# Patient Record
Sex: Male | Born: 1970 | Race: White | Hispanic: No | Marital: Single
Health system: Southern US, Community
[De-identification: ages and names within clinical notes are randomized; demographics above are authoritative.]

---

## 2004-12-26 ENCOUNTER — Encounter: Admission: RE | Admit: 2004-12-26 | Discharge: 2004-12-26 | Payer: Self-pay | Admitting: Otolaryngology

## 2006-08-13 IMAGING — CT CT NECK W/ CM
1 of 2 series · 10 of 14 positions shown, 13 images · IV contrast (75ML OMNI 300)
Comparison: none

CLINICAL DATA: Occasional discomfort.  Generally painless right neck lump.  Question GERD.  No known trauma. 
CT NECK WITH CONTRAST:
TECHNIQUE: Multidetector CT imaging of the neck was performed following the standard protocol during administration of intravenous contrast.
Contrast:  75 cc Omnipaque 300
No comparison.

[Series 2: neck w/ · axial · 0.39mm/px · z∈[-274,-32]mm · 10 of 118 slices shown, 13 images]
[im 11/118  soft-tissue]
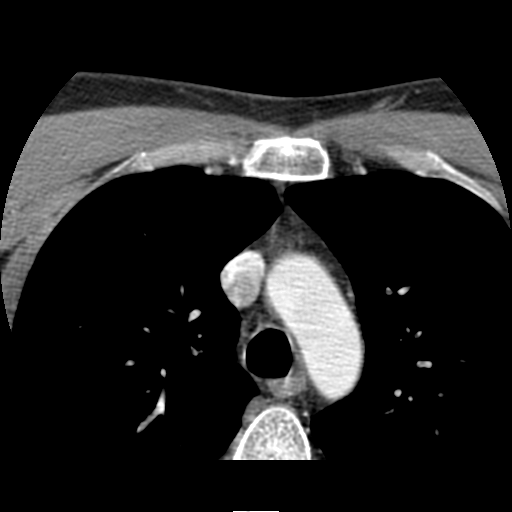
[im 11/118  bone]
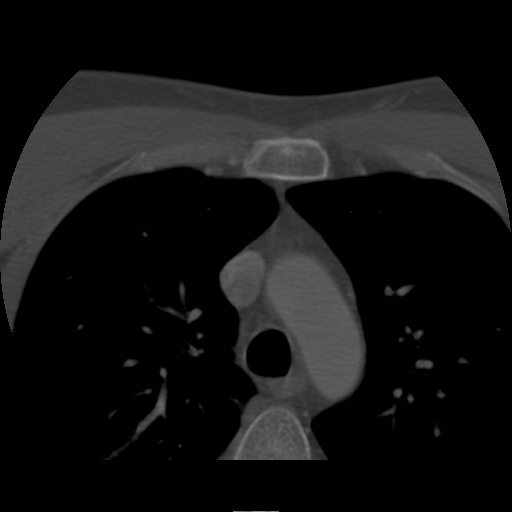
[im 22/118  bone]
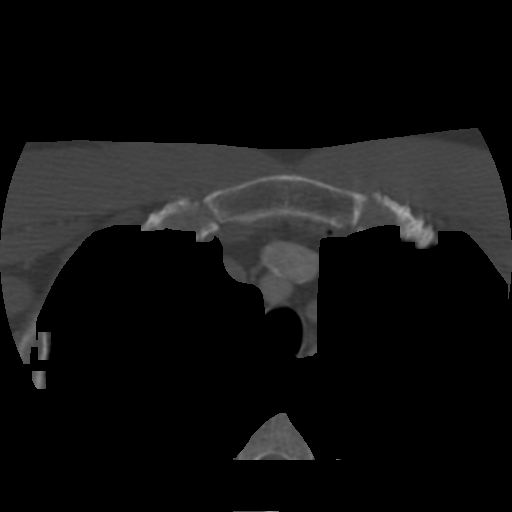
[im 32/118  bone]
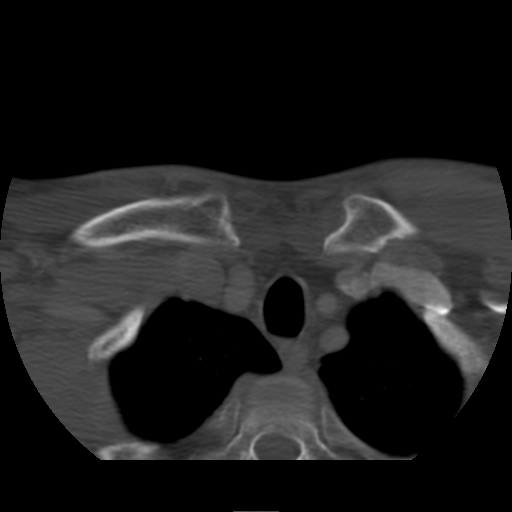
[im 43/118  bone]
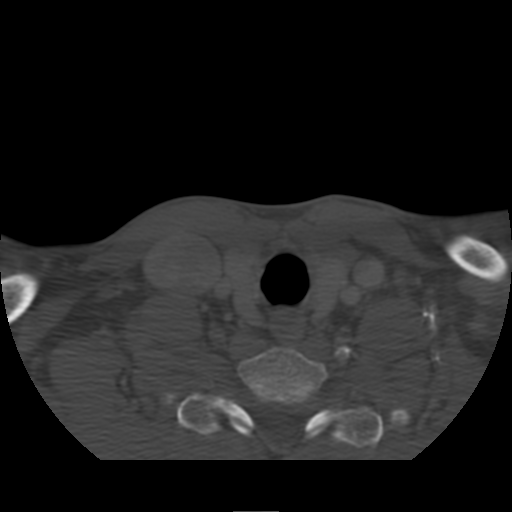
[im 54/118  soft-tissue]
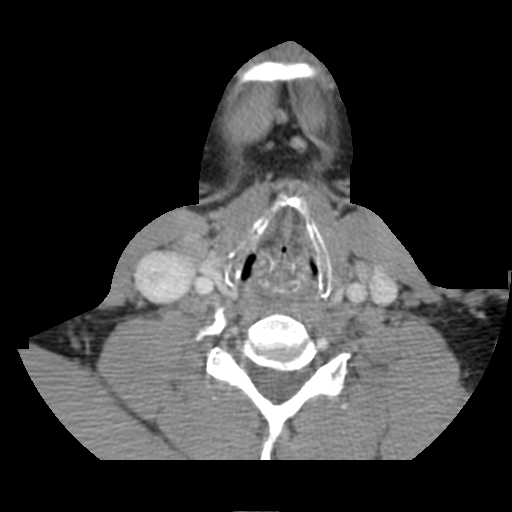
[im 54/118  bone]
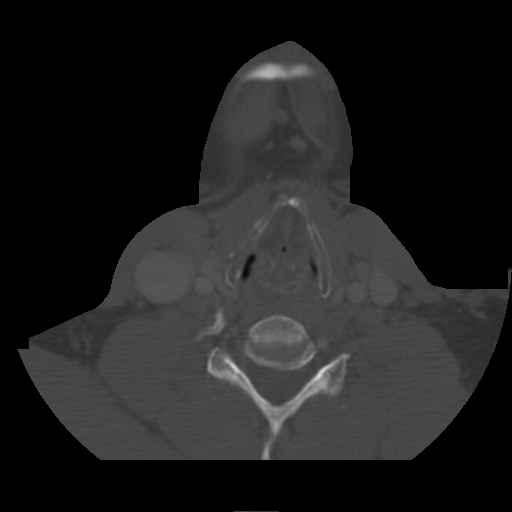
[im 64/118  bone]
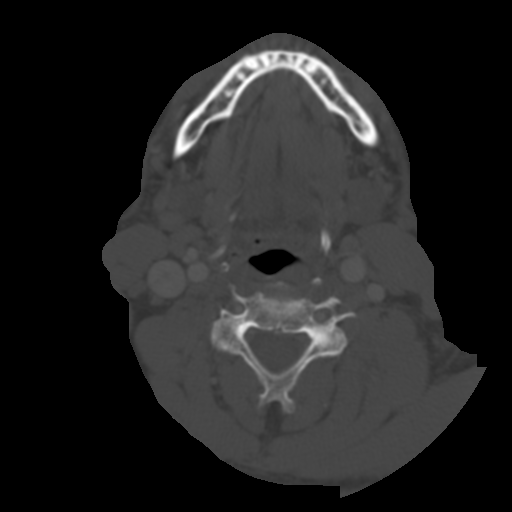
[im 75/118  bone]
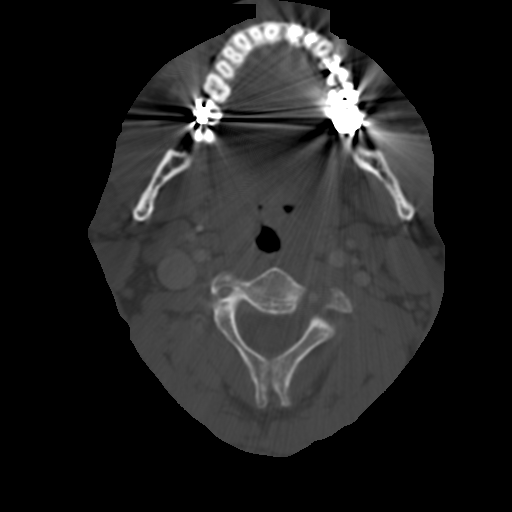
[im 86/118  bone]
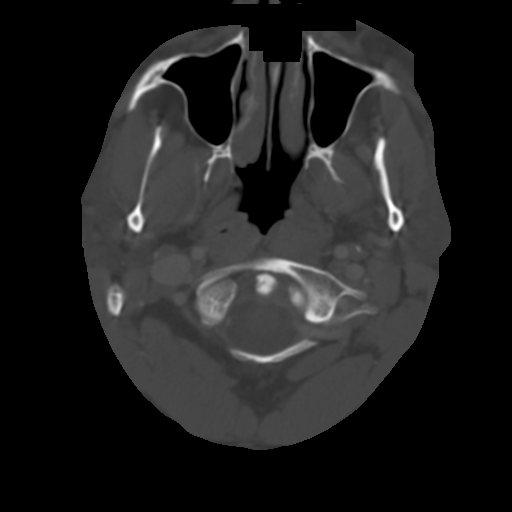
[im 96/118  soft-tissue]
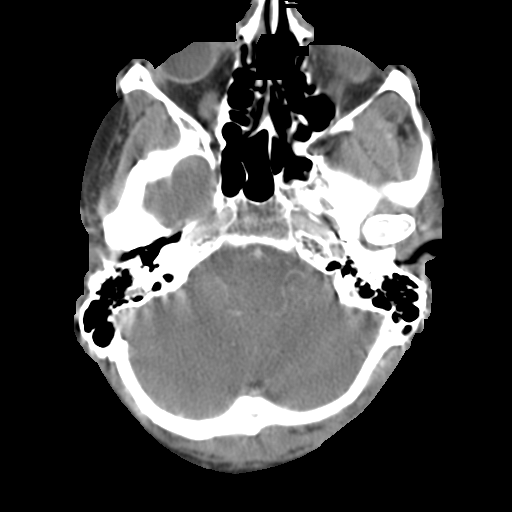
[im 96/118  bone]
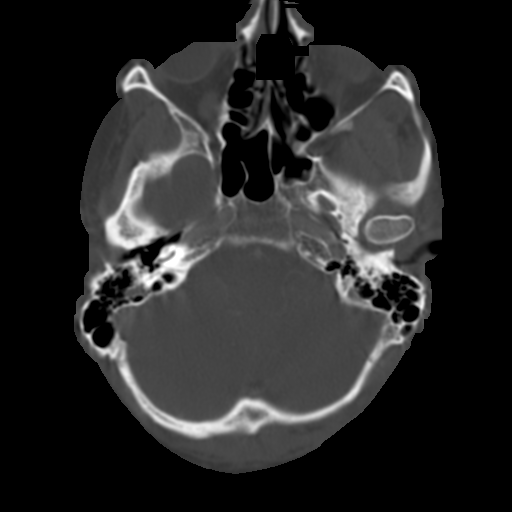
[im 107/118  bone]
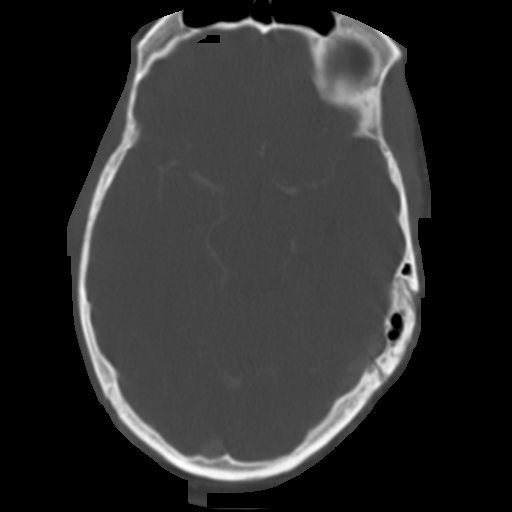

[10 of 14 positions shown; findings below may reference images not displayed]

FINDINGS: Region of symptoms as indicated by patient was marked with surface BB (images 60 ? 61).  Subjacent to the BB is the inferior aspect of the right submandibular gland with no CT abnormality of the gland seen.  Anterior to the right submandibular gland, is slightly enlarged lymph node measuring 8 x 15 mm (image 59).  However, benign appearing fatty hilum is seen related to this lymph node.  Increased number of small subcentimeter lymph nodes are seen along the bilateral II internal jugular and V posterior triangle to bilateral supraclavicular space.  Due to small size and anatomic features, this favors slight reactive adenitis.  The paranasal sinuses and bilateral mastoid air cells and middle ear cavities appear clear.  Naso-oro-hypopharynx, larynx, thyroid, lung apices, superior mediastinum, and salivary glands appear normal.
IMPRESSION: 1.  Area of clinical concern corresponds to inferior aspect of normal appearing right submandibular gland with slight enlargement likely benign lymph node anterior to this region.  Increased number of subcentimeter bilateral cervical lymph nodes favor benign reactive adenitis.
2.  Otherwise negative.

## 2015-06-10 ENCOUNTER — Other Ambulatory Visit: Payer: Self-pay | Admitting: Otolaryngology

## 2015-06-10 DIAGNOSIS — R590 Localized enlarged lymph nodes: Secondary | ICD-10-CM

## 2015-06-16 ENCOUNTER — Ambulatory Visit
Admission: RE | Admit: 2015-06-16 | Discharge: 2015-06-16 | Disposition: A | Discharge status: Home / Self Care | Payer: 59 | Source: Ambulatory Visit | Attending: Otolaryngology | Admitting: Otolaryngology

## 2015-06-16 DIAGNOSIS — R590 Localized enlarged lymph nodes: Secondary | ICD-10-CM

## 2015-06-16 MED ORDER — IOPAMIDOL (ISOVUE-300) INJECTION 61%
75.0000 mL | Freq: Once | INTRAVENOUS | Status: AC | PRN
  Administered 2015-06-16: 75 mL via INTRAVENOUS

## 2018-05-27 DIAGNOSIS — R590 Localized enlarged lymph nodes: Secondary | ICD-10-CM | POA: Diagnosis not present

## 2018-05-27 DIAGNOSIS — R221 Localized swelling, mass and lump, neck: Secondary | ICD-10-CM | POA: Diagnosis not present

## 2018-06-24 DIAGNOSIS — R221 Localized swelling, mass and lump, neck: Secondary | ICD-10-CM | POA: Diagnosis not present

## 2018-06-24 DIAGNOSIS — R590 Localized enlarged lymph nodes: Secondary | ICD-10-CM | POA: Diagnosis not present

## 2018-10-12 DIAGNOSIS — M546 Pain in thoracic spine: Secondary | ICD-10-CM | POA: Diagnosis not present

## 2019-04-20 DIAGNOSIS — D229 Melanocytic nevi, unspecified: Secondary | ICD-10-CM | POA: Diagnosis not present

## 2019-04-20 DIAGNOSIS — L218 Other seborrheic dermatitis: Secondary | ICD-10-CM | POA: Diagnosis not present

## 2019-04-20 DIAGNOSIS — D1801 Hemangioma of skin and subcutaneous tissue: Secondary | ICD-10-CM | POA: Diagnosis not present

## 2019-04-20 DIAGNOSIS — B078 Other viral warts: Secondary | ICD-10-CM | POA: Diagnosis not present

## 2019-04-20 DIAGNOSIS — D225 Melanocytic nevi of trunk: Secondary | ICD-10-CM | POA: Diagnosis not present

## 2020-01-26 DIAGNOSIS — R1010 Upper abdominal pain, unspecified: Secondary | ICD-10-CM | POA: Diagnosis not present

## 2020-01-26 DIAGNOSIS — K429 Umbilical hernia without obstruction or gangrene: Secondary | ICD-10-CM | POA: Diagnosis not present

## 2020-01-26 DIAGNOSIS — M6208 Separation of muscle (nontraumatic), other site: Secondary | ICD-10-CM | POA: Diagnosis not present

## 2020-01-27 DIAGNOSIS — R1010 Upper abdominal pain, unspecified: Secondary | ICD-10-CM | POA: Diagnosis not present

## 2020-02-12 DIAGNOSIS — M79602 Pain in left arm: Secondary | ICD-10-CM | POA: Diagnosis not present

## 2020-02-12 DIAGNOSIS — K458 Other specified abdominal hernia without obstruction or gangrene: Secondary | ICD-10-CM | POA: Diagnosis not present

## 2020-03-02 DIAGNOSIS — K429 Umbilical hernia without obstruction or gangrene: Secondary | ICD-10-CM | POA: Diagnosis not present

## 2020-03-02 DIAGNOSIS — Z1211 Encounter for screening for malignant neoplasm of colon: Secondary | ICD-10-CM | POA: Diagnosis not present

## 2020-03-02 DIAGNOSIS — R1032 Left lower quadrant pain: Secondary | ICD-10-CM | POA: Diagnosis not present

## 2020-03-07 DIAGNOSIS — M6208 Separation of muscle (nontraumatic), other site: Secondary | ICD-10-CM | POA: Diagnosis not present

## 2020-03-07 DIAGNOSIS — K432 Incisional hernia without obstruction or gangrene: Secondary | ICD-10-CM | POA: Diagnosis not present

## 2020-06-26 DIAGNOSIS — R102 Pelvic and perineal pain: Secondary | ICD-10-CM | POA: Diagnosis not present

## 2021-10-23 ENCOUNTER — Ambulatory Visit: Payer: Self-pay | Admitting: General Surgery

## 2021-10-23 NOTE — H&P (Signed)
Chief Complaint: Hernia       History of Present Illness: Jeremy Patterson is a 51 y.o. male who is seen today as an office consultation at the request of Dr. Seymour Bars for evaluation of Hernia .   Patient is a 51 year old male who follows back up secondary to incisional hernia.  Patient had a exploratory laparotomy for rupture of a blood vessel in his abdomen in 1988.  Patient has a large midline incision.  Patient has a umbilical hernia that he had for several years.  He states that it is becoming more tender at this point.  Patient would like to have this repaired.   Patient said no signs or symptoms of incarceration or strangulation.   Only appears to there is one hernia on exam.       Review of Systems: A complete review of systems was obtained from the patient.  I have reviewed this information and discussed as appropriate with the patient.  See HPI as well for other ROS.   Review of Systems  Constitutional:  Negative for fever.  HENT:  Negative for congestion.   Eyes:  Negative for blurred vision.  Respiratory:  Negative for cough, shortness of breath and wheezing.   Cardiovascular:  Negative for chest pain and palpitations.  Gastrointestinal:  Negative for heartburn.  Genitourinary:  Negative for dysuria.  Musculoskeletal:  Negative for myalgias.  Skin:  Negative for rash.  Neurological:  Negative for dizziness and headaches.  Psychiatric/Behavioral:  Negative for depression and suicidal ideas.   All other systems reviewed and are negative.      Medical History: Past Medical History No past medical history on file.    There is no problem list on file for this patient.     Past Surgical History Past Surgical History: Procedure Laterality Date  lymph node removal          Allergies No Known Allergies    No current outpatient medications on file prior to visit.   No current facility-administered medications on file prior to visit.     Family History No family  history on file.     Social History   Tobacco Use Smoking Status Never Smokeless Tobacco Not on file     Social History Social History    Socioeconomic History  Marital status: Single Tobacco Use  Smoking status: Never Vaping Use  Vaping Use: Never used Substance and Sexual Activity  Alcohol use: Yes  Drug use: Never      Objective:     Vitals:   10/23/21 1530 BP: 124/82 Pulse: 97 Temp: 36.8 C (98.2 F) SpO2: 98% Weight: (!) 113.4 kg (250 lb) Height: 175.3 cm (5\' 9" )   Body mass index is 36.92 kg/m.   Physical Exam Constitutional:      General: He is not in acute distress.    Appearance: Normal appearance.  HENT:     Head: Normocephalic.     Nose: No rhinorrhea.     Mouth/Throat:     Mouth: Mucous membranes are moist.     Pharynx: Oropharynx is clear.  Eyes:     General: No scleral icterus.    Pupils: Pupils are equal, round, and reactive to light.  Cardiovascular:     Rate and Rhythm: Normal rate.     Pulses: Normal pulses.  Pulmonary:     Effort: Pulmonary effort is normal. No respiratory distress.     Breath sounds: No stridor. No wheezing.  Abdominal:     General: Abdomen  is flat. There is no distension.     Tenderness: There is no abdominal tenderness. There is no guarding or rebound.     Hernia: A hernia is present. Hernia is present in the umbilical area.     Musculoskeletal:        General: Normal range of motion.     Cervical back: Normal range of motion and neck supple.  Skin:    General: Skin is warm and dry.     Capillary Refill: Capillary refill takes less than 2 seconds.     Coloration: Skin is not jaundiced.  Neurological:     General: No focal deficit present.     Mental Status: He is alert and oriented to person, place, and time. Mental status is at baseline.  Psychiatric:        Mood and Affect: Mood normal.        Thought Content: Thought content normal.        Judgment: Judgment normal.      Hernia Size: 3  cm Incarcerated: Yes Initial Hernia     Assessment and Plan: Diagnoses and all orders for this visit:   Incisional hernia without obstruction or gangrene     Jeremy Patterson is a 51 y.o. male    1.  We will proceed to the OR for a lap ventral hernia repair with mesh. 2. All risks and benefits were discussed with the patient, to generally include infection, bleeding, damage to surrounding structures, acute and chronic nerve pain, and recurrence. Alternatives were offered and described.  All questions were answered and the patient voiced understanding of the procedure and wishes to proceed at this point.             No follow-ups on file.   Axel Filler, MD, [facility], [facility]
